# Patient Record
Sex: Male | Born: 1945 | Smoking: Former smoker
Health system: Southern US, Community
[De-identification: ages and names within clinical notes are randomized; demographics above are authoritative.]

## PROBLEM LIST (undated history)

## (undated) DIAGNOSIS — F32A Depression, unspecified: Secondary | ICD-10-CM

## (undated) DIAGNOSIS — M1990 Unspecified osteoarthritis, unspecified site: Secondary | ICD-10-CM

## (undated) DIAGNOSIS — Z9889 Other specified postprocedural states: Secondary | ICD-10-CM

## (undated) DIAGNOSIS — R112 Nausea with vomiting, unspecified: Secondary | ICD-10-CM

## (undated) DIAGNOSIS — Z87442 Personal history of urinary calculi: Secondary | ICD-10-CM

## (undated) DIAGNOSIS — I10 Essential (primary) hypertension: Secondary | ICD-10-CM

## (undated) HISTORY — PX: BACK SURGERY: SHX140

## (undated) HISTORY — PX: REPLACEMENT TOTAL KNEE BILATERAL: SUR1225

## (undated) HISTORY — PX: NECK SURGERY: SHX720

---

## 2020-05-22 ENCOUNTER — Other Ambulatory Visit (HOSPITAL_COMMUNITY): Payer: Self-pay | Admitting: Urology

## 2020-05-22 ENCOUNTER — Other Ambulatory Visit: Payer: Self-pay | Admitting: Urology

## 2020-05-22 DIAGNOSIS — N200 Calculus of kidney: Secondary | ICD-10-CM

## 2020-07-03 NOTE — Patient Instructions (Signed)
DUE TO COVID-19 ONLY ONE VISITOR IS ALLOWED TO COME WITH YOU AND STAY IN THE WAITING ROOM ONLY DURING PRE OP AND PROCEDURE DAY OF SURGERY. THE 1 VISITOR  MAY VISIT WITH YOU AFTER SURGERY IN YOUR PRIVATE ROOM DURING VISITING HOURS ONLY!  YOU NEED TO HAVE A COVID 19 TEST ON___1-3-22____ @_______ , THIS TEST MUST BE DONE BEFORE SURGERY,  COVID TESTING SITE [address] [place]  , IT IS ON THE RIGHT GOING OUT [address] APPROXIMATELY  2 MINUTES PAST [employer] ON THE RIGHT. ONCE YOUR COVID TEST IS COMPLETED,  PLEASE BEGIN THE QUARANTINE INSTRUCTIONS AS OUTLINED IN YOUR HANDOUT.                Brian Wilkins  07/03/2020   Your procedure is scheduled on: 07-12-20   Report to [facility] Main  Entrance   Report to admitting at       0800 AM     Call this number if you have problems the morning of surgery [phone]    Remember: Do not eat food  :After Midnight. You may have clear liquids until  1045 am then nothing by mouth    CLEAR LIQUID DIET   Foods Allowed                                                                       Black Coffee and tea, regular and decaf                              Plain Jell-O any favor except red or purple                                          Fruit ices (not with fruit pulp)                                      Iced Popsicles                                     Carbonated beverages, regular and diet                                    Cranberry, grape and apple juices Sports drinks like Gatorade Lightly seasoned clear broth or consume(fat free) Sugar, honey syrup       BRUSH YOUR TEETH MORNING OF SURGERY AND RINSE YOUR MOUTH OUT, NO CHEWING GUM CANDY OR MINTS.     Take these medicines the morning of surgery with A SIP OF WATER:   DO NOT TAKE ANY DIABETIC MEDICATIONS DAY OF YOUR SURGERY                               You may not have any metal on your body including hair  pins and               piercings  Do not wear jewelry, , lotions, powders or perfumes, deodorant                   Men may shave face and neck.   Do not bring valuables to the hospital. [place] IS NOT             RESPONSIBLE   FOR VALUABLES.  Contacts, dentures or bridgework may not be worn into surgery.       Patients discharged the day of surgery will not be allowed to drive home. IF YOU ARE HAVING SURGERY AND GOING HOME THE SAME DAY, YOU MUST HAVE AN ADULT TO DRIVE YOU HOME AND BE WITH YOU FOR 24 HOURS. YOU MAY GO HOME BY TAXI OR UBER OR ORTHERWISE, BUT AN ADULT MUST ACCOMPANY YOU HOME AND STAY WITH YOU FOR 24 HOURS.  Name and phone number of your driver:  Special Instructions: N/A              Please read over the following fact sheets you were given: _____________________________________________________________________             [facility] - Preparing for Surgery Before surgery, you can play an important role.  Because skin is not sterile, your skin needs to be as free of germs as possible.  You can reduce the number of germs on your skin by washing with CHG (chlorahexidine gluconate) soap before surgery.  CHG is an antiseptic cleaner which kills germs and bonds with the skin to continue killing germs even after washing. Please DO NOT use if you have an allergy to CHG or antibacterial soaps.  If your skin becomes reddened/irritated stop using the CHG and inform your nurse when you arrive at Short Stay. Do not shave (including legs and underarms) for at least 48 hours prior to the first CHG shower.  You may shave your face/neck. Please follow these instructions carefully:  1.  Shower with CHG Soap the night before surgery and the  morning of Surgery.  2.  If you choose to wash your hair, wash your hair first as usual with your  normal  shampoo.  3.  After you shampoo, rinse your hair and body thoroughly to remove the  shampoo.                           4.  Use CHG as you would any other liquid soap.   You can apply chg directly  to the skin and wash                       Gently with a scrungie or clean washcloth.  5.  Apply the CHG Soap to your body ONLY FROM THE NECK DOWN.   Do not use on face/ open                           Wound or open sores. Avoid contact with eyes, ears mouth and genitals (private parts).                       Wash face,  Genitals (private parts) with your normal soap.             6.  Wash thoroughly, paying special attention to the area where your  surgery  will be performed.  7.  Thoroughly rinse your body with warm water from the neck down.  8.  DO NOT shower/wash with your normal soap after using and rinsing off  the CHG Soap.                9.  Pat yourself dry with a clean towel.            10.  Wear clean pajamas.            11.  Place clean sheets on your bed the night of your first shower and do not  sleep with pets. Day of Surgery : Do not apply any lotions/deodorants the morning of surgery.  Please wear clean clothes to the hospital/surgery center.  FAILURE TO FOLLOW THESE INSTRUCTIONS MAY RESULT IN THE CANCELLATION OF YOUR SURGERY PATIENT SIGNATURE_________________________________  NURSE SIGNATURE__________________________________  ________________________________________________________________________

## 2020-07-03 NOTE — Progress Notes (Signed)
PCP -  Cardiologist -   PPM/ICD -  Device Orders -  Rep Notified -   Chest x-ray -  EKG -  Stress Test -  ECHO -  Cardiac Cath -   Sleep Study -  CPAP -   Fasting Blood Sugar -  Checks Blood Sugar _____ times a day  Blood Thinner Instructions: Aspirin Instructions:  [facility] - PRE-SURGERY  COVID TEST- 07-09-20  Activity- Anesthesia review:   Patient denies shortness of breath, fever, cough and chest pain at PAT appointment   All instructions explained to the patient, with a verbal understanding of the material. Patient agrees to go over the instructions while at home for a better understanding. Patient also instructed to self quarantine after being tested for COVID-19. The opportunity to ask questions was provided.

## 2020-07-04 ENCOUNTER — Encounter (HOSPITAL_COMMUNITY)
Admission: RE | Admit: 2020-07-04 | Discharge: 2020-07-04 | Disposition: A | Discharge status: Home / Self Care | Payer: Medicaid - Out of State | Source: Ambulatory Visit | Attending: Anesthesiology | Admitting: Anesthesiology

## 2020-07-04 NOTE — Progress Notes (Signed)
Pt. Did not come to preop. Called pt. Phone # went straight to voicemail. LVM with [facility] to inform pt. Did not come to preop

## 2020-07-09 ENCOUNTER — Other Ambulatory Visit: Payer: Self-pay

## 2020-07-09 ENCOUNTER — Encounter (HOSPITAL_COMMUNITY): Payer: Self-pay | Admitting: Urology

## 2020-07-09 NOTE — Progress Notes (Signed)
COVID Vaccine Completed:  x3 Date COVID Vaccine completed: COVID vaccine manufacturer: Pfizer    Moderna   [employer]   PCP - [place] Cardiologist - N/A  Chest x-ray - ? at [facility] EKG - ? At [facility] Stress Test -  ECHO -  Cardiac Cath -  Pacemaker/ICD device last checked:  Sleep Study - N/A CPAP -   Fasting Blood Sugar - N/A Checks Blood Sugar _____ times a day  Blood Thinner Instructions:  N/A Aspirin Instructions: Last Dose:  Anesthesia review:   Patient denies shortness of breath, fever, cough and chest pain at PAT appointment.  Patient able to climb a flight of stairs and perform ADLs independently.   Patient verbalized understanding of instructions that were given to them at the PAT appointment. Patient was also instructed that they will need to review over the PAT instructions again at home before surgery.

## 2020-07-10 ENCOUNTER — Other Ambulatory Visit: Payer: Self-pay | Admitting: Radiology

## 2020-07-10 ENCOUNTER — Other Ambulatory Visit (HOSPITAL_COMMUNITY)
Admission: RE | Admit: 2020-07-10 | Discharge: 2020-07-10 | Disposition: A | Discharge status: Home / Self Care | Payer: Medicare PPO | Source: Ambulatory Visit | Attending: Urology | Admitting: Urology

## 2020-07-10 DIAGNOSIS — Z01812 Encounter for preprocedural laboratory examination: Secondary | ICD-10-CM | POA: Insufficient documentation

## 2020-07-10 DIAGNOSIS — Z20822 Contact with and (suspected) exposure to covid-19: Secondary | ICD-10-CM | POA: Insufficient documentation

## 2020-07-10 LAB — SARS CORONAVIRUS 2 (TAT 6-24 HRS): SARS Coronavirus 2: NEGATIVE

## 2020-07-12 ENCOUNTER — Observation Stay (HOSPITAL_COMMUNITY)
Admission: RE | Admit: 2020-07-12 | Discharge: 2020-07-13 | Disposition: A | Discharge status: Home / Self Care | Payer: Medicare PPO | Attending: Urology | Admitting: Urology

## 2020-07-12 ENCOUNTER — Other Ambulatory Visit: Payer: Self-pay

## 2020-07-12 ENCOUNTER — Ambulatory Visit (HOSPITAL_COMMUNITY)
Admission: RE | Admit: 2020-07-12 | Discharge: 2020-07-12 | Disposition: A | Discharge status: Home / Self Care | Payer: Medicare PPO | Source: Ambulatory Visit | Attending: Urology | Admitting: Urology

## 2020-07-12 ENCOUNTER — Observation Stay (HOSPITAL_COMMUNITY): Payer: Medicare PPO

## 2020-07-12 ENCOUNTER — Ambulatory Visit (HOSPITAL_COMMUNITY): Payer: Medicare PPO | Admitting: Anesthesiology

## 2020-07-12 ENCOUNTER — Encounter (HOSPITAL_COMMUNITY)
Admission: RE | Disposition: A | Discharge status: Home / Self Care | Payer: Self-pay | Source: Home / Self Care | Attending: Urology

## 2020-07-12 ENCOUNTER — Encounter (HOSPITAL_COMMUNITY): Payer: Self-pay | Admitting: Urology

## 2020-07-12 DIAGNOSIS — N200 Calculus of kidney: Secondary | ICD-10-CM

## 2020-07-12 DIAGNOSIS — Z96653 Presence of artificial knee joint, bilateral: Secondary | ICD-10-CM | POA: Diagnosis not present

## 2020-07-12 DIAGNOSIS — Z79899 Other long term (current) drug therapy: Secondary | ICD-10-CM | POA: Insufficient documentation

## 2020-07-12 DIAGNOSIS — I10 Essential (primary) hypertension: Secondary | ICD-10-CM | POA: Diagnosis not present

## 2020-07-12 HISTORY — PX: IR URETERAL STENT LEFT NEW ACCESS W/O SEP NEPHROSTOMY CATH: IMG6075

## 2020-07-12 HISTORY — DX: Personal history of urinary calculi: Z87.442

## 2020-07-12 HISTORY — DX: Other specified postprocedural states: Z98.890

## 2020-07-12 HISTORY — DX: Other specified postprocedural states: R11.2

## 2020-07-12 HISTORY — DX: Depression, unspecified: F32.A

## 2020-07-12 HISTORY — DX: Essential (primary) hypertension: I10

## 2020-07-12 HISTORY — PX: NEPHROLITHOTOMY: SHX5134

## 2020-07-12 HISTORY — DX: Unspecified osteoarthritis, unspecified site: M19.90

## 2020-07-12 LAB — CBC WITH DIFFERENTIAL/PLATELET
Abs Immature Granulocytes: 0.02 10*3/uL (ref 0.00–0.07)
Basophils Absolute: 0.1 10*3/uL (ref 0.0–0.1)
Basophils Relative: 1 %
Eosinophils Absolute: 0.2 10*3/uL (ref 0.0–0.5)
Eosinophils Relative: 3 %
HCT: 42.3 % (ref 39.0–52.0)
Hemoglobin: 12.3 g/dL — ABNORMAL LOW (ref 13.0–17.0)
Immature Granulocytes: 0 %
Lymphocytes Relative: 28 %
Lymphs Abs: 1.7 10*3/uL (ref 0.7–4.0)
MCH: 21.8 pg — ABNORMAL LOW (ref 26.0–34.0)
MCHC: 29.1 g/dL — ABNORMAL LOW (ref 30.0–36.0)
MCV: 75 fL — ABNORMAL LOW (ref 80.0–100.0)
Monocytes Absolute: 0.6 10*3/uL (ref 0.1–1.0)
Monocytes Relative: 10 %
Neutro Abs: 3.5 10*3/uL (ref 1.7–7.7)
Neutrophils Relative %: 58 %
Platelets: 202 10*3/uL (ref 150–400)
RBC: 5.64 MIL/uL (ref 4.22–5.81)
RDW: 16.7 % — ABNORMAL HIGH (ref 11.5–15.5)
WBC: 6 10*3/uL (ref 4.0–10.5)
nRBC: 0 % (ref 0.0–0.2)

## 2020-07-12 LAB — BASIC METABOLIC PANEL
Anion gap: 6 (ref 5–15)
BUN: 16 mg/dL (ref 8–23)
CO2: 26 mmol/L (ref 22–32)
Calcium: 11.1 mg/dL — ABNORMAL HIGH (ref 8.9–10.3)
Chloride: 108 mmol/L (ref 98–111)
Creatinine, Ser: 1.49 mg/dL — ABNORMAL HIGH (ref 0.61–1.24)
GFR, Estimated: 49 mL/min — ABNORMAL LOW (ref >60)
Glucose, Bld: 90 mg/dL (ref 70–99)
Potassium: 4.7 mmol/L (ref 3.5–5.1)
Sodium: 140 mmol/L (ref 135–145)

## 2020-07-12 LAB — TYPE AND SCREEN
ABO/RH(D): B POS
Antibody Screen: NEGATIVE

## 2020-07-12 LAB — PROTIME-INR
INR: 1.1 (ref 0.8–1.2)
Prothrombin Time: 13.6 seconds (ref 11.4–15.2)

## 2020-07-12 LAB — ABO/RH: ABO/RH(D): B POS

## 2020-07-12 SURGERY — NEPHROLITHOTOMY PERCUTANEOUS
Anesthesia: General | Site: Back | Laterality: Left

## 2020-07-12 MED ORDER — DEXAMETHASONE SODIUM PHOSPHATE 10 MG/ML IJ SOLN
INTRAMUSCULAR | Status: DC | PRN
  Administered 2020-07-12: 10 mg via INTRAVENOUS

## 2020-07-12 MED ORDER — EPHEDRINE SULFATE-NACL 50-0.9 MG/10ML-% IV SOSY
PREFILLED_SYRINGE | INTRAVENOUS | Status: DC | PRN
  Administered 2020-07-12: 10 mg via INTRAVENOUS

## 2020-07-12 MED ORDER — LIDOCAINE HCL (PF) 2 % IJ SOLN
INTRAMUSCULAR | Status: AC
  Filled 2020-07-12: qty 5

## 2020-07-12 MED ORDER — ROCURONIUM BROMIDE 10 MG/ML (PF) SYRINGE
PREFILLED_SYRINGE | INTRAVENOUS | Status: AC
  Filled 2020-07-12: qty 10

## 2020-07-12 MED ORDER — ORAL CARE MOUTH RINSE: 15.0000 mL | Freq: Once | OROMUCOSAL | Status: AC

## 2020-07-12 MED ORDER — QUETIAPINE FUMARATE 200 MG PO TABS
200.0000 mg | ORAL_TABLET | Freq: Every day | ORAL | Status: DC
  Administered 2020-07-12: 200 mg via ORAL
  Filled 2020-07-12: qty 1

## 2020-07-12 MED ORDER — OXYCODONE HCL 5 MG PO TABS
5.0000 mg | ORAL_TABLET | ORAL | Status: DC | PRN
Start: 2020-07-12 — End: 2020-07-13
  Administered 2020-07-13: 5 mg via ORAL
  Filled 2020-07-12: qty 1

## 2020-07-12 MED ORDER — ONDANSETRON HCL 4 MG/2ML IJ SOLN
INTRAMUSCULAR | Status: AC
  Administered 2020-07-12: 4 mg
  Filled 2020-07-12: qty 2

## 2020-07-12 MED ORDER — PHENYLEPHRINE 40 MCG/ML (10ML) SYRINGE FOR IV PUSH (FOR BLOOD PRESSURE SUPPORT)
PREFILLED_SYRINGE | INTRAVENOUS | Status: AC
  Filled 2020-07-12: qty 10

## 2020-07-12 MED ORDER — ACETAMINOPHEN 325 MG PO TABS: 650.0000 mg | ORAL_TABLET | ORAL | Status: DC | PRN

## 2020-07-12 MED ORDER — SULFAMETHOXAZOLE-TRIMETHOPRIM 800-160 MG PO TABS
1.0000 | ORAL_TABLET | Freq: Two times a day (BID) | ORAL | Status: DC
  Administered 2020-07-12 – 2020-07-13 (×2): 1 via ORAL
  Filled 2020-07-12 (×2): qty 1

## 2020-07-12 MED ORDER — FENTANYL CITRATE (PF) 250 MCG/5ML IJ SOLN
INTRAMUSCULAR | Status: AC
  Filled 2020-07-12: qty 5

## 2020-07-12 MED ORDER — IOHEXOL 300 MG/ML  SOLN
INTRAMUSCULAR | Status: DC | PRN
  Administered 2020-07-12: 10 mL

## 2020-07-12 MED ORDER — FENTANYL CITRATE (PF) 100 MCG/2ML IJ SOLN
INTRAMUSCULAR | Status: DC | PRN
  Administered 2020-07-12 (×2): 50 ug via INTRAVENOUS

## 2020-07-12 MED ORDER — 0.9 % SODIUM CHLORIDE (POUR BTL) OPTIME
TOPICAL | Status: DC | PRN
  Administered 2020-07-12: 1000 mL

## 2020-07-12 MED ORDER — CIPROFLOXACIN IN D5W 400 MG/200ML IV SOLN
INTRAVENOUS | Status: AC
  Administered 2020-07-12: 400 mg via INTRAVENOUS
  Filled 2020-07-12: qty 200

## 2020-07-12 MED ORDER — LIDOCAINE-EPINEPHRINE 1 %-1:100000 IJ SOLN
INTRAMUSCULAR | Status: AC | PRN
  Administered 2020-07-12: 10 mL via INTRADERMAL

## 2020-07-12 MED ORDER — OXYCODONE HCL 5 MG/5ML PO SOLN: 5.0000 mg | Freq: Once | ORAL | Status: DC | PRN

## 2020-07-12 MED ORDER — LOSARTAN POTASSIUM 50 MG PO TABS
100.0000 mg | ORAL_TABLET | Freq: Every day | ORAL | Status: DC
  Administered 2020-07-13: 100 mg via ORAL
  Filled 2020-07-12: qty 2

## 2020-07-12 MED ORDER — IOHEXOL 300 MG/ML  SOLN
50.0000 mL | Freq: Once | INTRAMUSCULAR | Status: AC | PRN
  Administered 2020-07-12: 15 mL

## 2020-07-12 MED ORDER — OXYCODONE HCL 5 MG PO TABS: 5.0000 mg | ORAL_TABLET | Freq: Once | ORAL | Status: DC | PRN

## 2020-07-12 MED ORDER — LIDOCAINE 2% (20 MG/ML) 5 ML SYRINGE
INTRAMUSCULAR | Status: DC | PRN
  Administered 2020-07-12: 60 mg via INTRAVENOUS

## 2020-07-12 MED ORDER — CIPROFLOXACIN IN D5W 400 MG/200ML IV SOLN
400.0000 mg | INTRAVENOUS | Status: AC
  Filled 2020-07-12: qty 200

## 2020-07-12 MED ORDER — SENNA 8.6 MG PO TABS
1.0000 | ORAL_TABLET | Freq: Two times a day (BID) | ORAL | Status: DC
  Administered 2020-07-12 – 2020-07-13 (×2): 8.6 mg via ORAL
  Filled 2020-07-12 (×2): qty 1

## 2020-07-12 MED ORDER — ONDANSETRON HCL 4 MG/2ML IJ SOLN
INTRAMUSCULAR | Status: AC
  Filled 2020-07-12: qty 2

## 2020-07-12 MED ORDER — FENTANYL CITRATE (PF) 100 MCG/2ML IJ SOLN
25.0000 ug | INTRAMUSCULAR | Status: DC | PRN
Start: 2020-07-12 — End: 2020-07-13
  Administered 2020-07-12: 50 ug via INTRAVENOUS

## 2020-07-12 MED ORDER — GLYCOPYRROLATE 0.2 MG/ML IJ SOLN
INTRAMUSCULAR | Status: DC | PRN
  Administered 2020-07-12: .2 mg via INTRAVENOUS

## 2020-07-12 MED ORDER — FENTANYL CITRATE (PF) 100 MCG/2ML IJ SOLN
25.0000 ug | INTRAMUSCULAR | Status: DC | PRN
  Administered 2020-07-12: 50 ug via INTRAVENOUS

## 2020-07-12 MED ORDER — HYDROMORPHONE HCL 1 MG/ML IJ SOLN: 0.5000 mg | INTRAMUSCULAR | Status: DC | PRN

## 2020-07-12 MED ORDER — MIDAZOLAM HCL 2 MG/2ML IJ SOLN
INTRAMUSCULAR | Status: AC
  Filled 2020-07-12: qty 2

## 2020-07-12 MED ORDER — CIPROFLOXACIN IN D5W 400 MG/200ML IV SOLN: 400.0000 mg | INTRAVENOUS | Status: DC

## 2020-07-12 MED ORDER — LIDOCAINE-EPINEPHRINE 1 %-1:100000 IJ SOLN
INTRAMUSCULAR | Status: AC
  Filled 2020-07-12: qty 1

## 2020-07-12 MED ORDER — DEXAMETHASONE SODIUM PHOSPHATE 10 MG/ML IJ SOLN
INTRAMUSCULAR | Status: AC
  Filled 2020-07-12: qty 1

## 2020-07-12 MED ORDER — FENTANYL CITRATE (PF) 100 MCG/2ML IJ SOLN
INTRAMUSCULAR | Status: AC
  Filled 2020-07-12: qty 2

## 2020-07-12 MED ORDER — GABAPENTIN 300 MG PO CAPS
ORAL_CAPSULE | ORAL | Status: AC
  Filled 2020-07-12: qty 1

## 2020-07-12 MED ORDER — FENTANYL CITRATE (PF) 100 MCG/2ML IJ SOLN
INTRAMUSCULAR | Status: AC | PRN
Start: 2020-07-12 — End: 2020-07-12
  Administered 2020-07-12 (×2): 50 ug via INTRAVENOUS

## 2020-07-12 MED ORDER — MIDAZOLAM HCL 2 MG/2ML IJ SOLN
INTRAMUSCULAR | Status: AC | PRN
  Administered 2020-07-12 (×4): 1 mg via INTRAVENOUS

## 2020-07-12 MED ORDER — SUGAMMADEX SODIUM 200 MG/2ML IV SOLN
INTRAVENOUS | Status: DC | PRN
  Administered 2020-07-12: 200 mg via INTRAVENOUS
  Administered 2020-07-12: 100 mg via INTRAVENOUS

## 2020-07-12 MED ORDER — ONDANSETRON HCL 4 MG/2ML IJ SOLN: 4.0000 mg | Freq: Once | INTRAMUSCULAR | Status: DC | PRN

## 2020-07-12 MED ORDER — ONDANSETRON HCL 4 MG/2ML IJ SOLN: 4.0000 mg | INTRAMUSCULAR | Status: DC | PRN

## 2020-07-12 MED ORDER — LACTATED RINGERS IV SOLN: INTRAVENOUS | Status: DC

## 2020-07-12 MED ORDER — ONDANSETRON HCL 4 MG/2ML IJ SOLN
INTRAMUSCULAR | Status: DC | PRN
  Administered 2020-07-12: 4 mg via INTRAVENOUS

## 2020-07-12 MED ORDER — AMLODIPINE BESYLATE 10 MG PO TABS
10.0000 mg | ORAL_TABLET | Freq: Every day | ORAL | Status: DC
  Administered 2020-07-13: 10 mg via ORAL
  Filled 2020-07-12: qty 1

## 2020-07-12 MED ORDER — CHLORHEXIDINE GLUCONATE 0.12% ORAL RINSE (MEDLINE KIT)
15.0000 mL | Freq: Once | OROMUCOSAL | Status: AC
  Administered 2020-07-12: 15 mL via OROMUCOSAL

## 2020-07-12 MED ORDER — EPHEDRINE 5 MG/ML INJ
INTRAVENOUS | Status: AC
  Filled 2020-07-12: qty 10

## 2020-07-12 MED ORDER — FENTANYL CITRATE (PF) 100 MCG/2ML IJ SOLN
INTRAMUSCULAR | Status: AC
  Filled 2020-07-12: qty 4

## 2020-07-12 MED ORDER — ROCURONIUM BROMIDE 10 MG/ML (PF) SYRINGE
PREFILLED_SYRINGE | INTRAVENOUS | Status: DC | PRN
  Administered 2020-07-12: 50 mg via INTRAVENOUS

## 2020-07-12 MED ORDER — SODIUM CHLORIDE 0.9 % IR SOLN
Status: DC | PRN
  Administered 2020-07-12: 6000 mL

## 2020-07-12 MED ORDER — SODIUM CHLORIDE 0.45 % IV SOLN: INTRAVENOUS | Status: DC

## 2020-07-12 MED ORDER — PROPOFOL 10 MG/ML IV BOLUS
INTRAVENOUS | Status: AC
  Filled 2020-07-12: qty 20

## 2020-07-12 MED ORDER — PHENYLEPHRINE 40 MCG/ML (10ML) SYRINGE FOR IV PUSH (FOR BLOOD PRESSURE SUPPORT)
PREFILLED_SYRINGE | INTRAVENOUS | Status: DC | PRN
  Administered 2020-07-12 (×3): 80 ug via INTRAVENOUS
  Administered 2020-07-12 (×2): 120 ug via INTRAVENOUS
  Administered 2020-07-12: 80 ug via INTRAVENOUS
  Administered 2020-07-12: 120 ug via INTRAVENOUS

## 2020-07-12 MED ORDER — GABAPENTIN 300 MG PO CAPS
300.0000 mg | ORAL_CAPSULE | Freq: Every day | ORAL | Status: DC
  Administered 2020-07-12: 300 mg via ORAL

## 2020-07-12 MED ORDER — PROPOFOL 10 MG/ML IV BOLUS
INTRAVENOUS | Status: DC | PRN
  Administered 2020-07-12: 150 mg via INTRAVENOUS

## 2020-07-12 SURGICAL SUPPLY — 49 items
BAG URINE DRAIN 2000ML AR STRL (UROLOGICAL SUPPLIES) IMPLANT
BASKET ZERO TIP NITINOL 2.4FR (BASKET) ×3 IMPLANT
BENZOIN TINCTURE PRP APPL 2/3 (GAUZE/BANDAGES/DRESSINGS) ×6 IMPLANT
BLADE SURG 15 STRL LF DISP TIS (BLADE) ×2 IMPLANT
BLADE SURG 15 STRL SS (BLADE) ×1
CATH FOLEY 2W COUNCIL 20FR 5CC (CATHETERS) IMPLANT
CATH ROBINSON RED A/P 20FR (CATHETERS) IMPLANT
CATH X-FORCE N30 NEPHROSTOMY (TUBING) ×3 IMPLANT
CHLORAPREP W/TINT 26 (MISCELLANEOUS) ×3 IMPLANT
COVER SURGICAL LIGHT HANDLE (MISCELLANEOUS) ×3 IMPLANT
COVER WAND RF STERILE (DRAPES) IMPLANT
DRAPE C-ARM 42X120 X-RAY (DRAPES) ×3 IMPLANT
DRAPE LINGEMAN PERC (DRAPES) ×3 IMPLANT
DRAPE SURG IRRIG POUCH 19X23 (DRAPES) ×3 IMPLANT
DRSG PAD ABDOMINAL 8X10 ST (GAUZE/BANDAGES/DRESSINGS) IMPLANT
DRSG TEGADERM 8X12 (GAUZE/BANDAGES/DRESSINGS) ×6 IMPLANT
EXTRACTOR STONE 1.7FRX115CM (UROLOGICAL SUPPLIES) ×3 IMPLANT
GAUZE SPONGE 4X4 12PLY STRL (GAUZE/BANDAGES/DRESSINGS) ×3 IMPLANT
GLOVE BIOGEL M 8.0 STRL (GLOVE) ×3 IMPLANT
GOWN STRL REUS W/TWL XL LVL3 (GOWN DISPOSABLE) ×3 IMPLANT
GUIDEWIRE AMPLAZ .035X145 (WIRE) ×6 IMPLANT
KIT BASIN OR (CUSTOM PROCEDURE TRAY) ×3 IMPLANT
KIT PROBE 340X3.4XDISP GRN (MISCELLANEOUS) ×2 IMPLANT
KIT PROBE TRILOGY 3.4X340 (MISCELLANEOUS) ×1
KIT PROBE TRILOGY 3.9X350 (MISCELLANEOUS) IMPLANT
KIT TURNOVER KIT A (KITS) IMPLANT
LASER FIB FLEXIVA PULSE ID 365 (Laser) IMPLANT
LASER FIB FLEXIVA PULSE ID 550 (Laser) IMPLANT
LASER FIB FLEXIVA PULSE ID 910 (Laser) IMPLANT
MANIFOLD NEPTUNE II (INSTRUMENTS) ×3 IMPLANT
NS IRRIG 1000ML POUR BTL (IV SOLUTION) ×3 IMPLANT
PACK CYSTO (CUSTOM PROCEDURE TRAY) ×3 IMPLANT
PENCIL SMOKE EVACUATOR (MISCELLANEOUS) IMPLANT
SHEATH PEELAWAY SET 9 (SHEATH) ×3 IMPLANT
SPONGE LAP 4X18 RFD (DISPOSABLE) ×3 IMPLANT
STENT URET 6FRX24 CONTOUR (STENTS) ×3 IMPLANT
SUT SILK 2 0 30  PSL (SUTURE) ×1
SUT SILK 2 0 30 PSL (SUTURE) ×2 IMPLANT
SYR 10ML LL (SYRINGE) ×3 IMPLANT
SYR 20ML LL LF (SYRINGE) ×6 IMPLANT
TAPE CLOTH SURG 6X10 WHT LF (GAUZE/BANDAGES/DRESSINGS) ×3 IMPLANT
TRACTIP FLEXIVA PULS ID 200XHI (Laser) IMPLANT
TRACTIP FLEXIVA PULSE ID 200 (Laser)
TRAY FOLEY MTR SLVR 14FR STAT (SET/KITS/TRAYS/PACK) IMPLANT
TRAY FOLEY MTR SLVR 16FR STAT (SET/KITS/TRAYS/PACK) ×3 IMPLANT
TUBING CONNECTING 10 (TUBING) ×6 IMPLANT
TUBING STONE CATCHER TRILOGY (MISCELLANEOUS) ×3 IMPLANT
TUBING UROLOGY SET (TUBING) ×3 IMPLANT
WATER STERILE IRR 1000ML POUR (IV SOLUTION) ×3 IMPLANT

## 2020-07-12 NOTE — Procedures (Signed)
Interventional Radiology Procedure Note  Procedure: Percutaneous nephroureteral catheter access  Findings: Please refer to procedural dictation for full description. 5 Fr catheter placed via lower pole calyx with distal tip in bladder.  Puncture made inferior to large mid to lower pole cyst.    Complications: None immediate  Estimated Blood Loss: < 5 mL  Recommendations: To OR with Dr. Retta Diones.   Marliss Coots, MD Pager: [phone]

## 2020-07-12 NOTE — Consult Note (Signed)
Chief Complaint: Patient was seen in consultation today for left percutaneous nephrostomy/nephroureteral catheter placement and possible left renal cyst aspiration  Referring Physician(s): Dahlstedt,S  Supervising Physician: Marliss Coots  Patient Status: [facility] - Out-pt TBA  History of Present Illness: Shamarr P. Branam is a 75 y.o. male with past medical history of arthritis, depression, hypertension, diverticulosis and nephrolithiasis with a recently noted 1.9 cm left renal pelvic stone as well as 3.5 cm left renal cyst.  He presents today for left percutaneous nephrostomy/nephroureteral catheter placement as well as possible image guided left renal cyst aspiration prior to nephrolithotomy.  Past Medical History:  Diagnosis Date  . Arthritis    knees  . Depression   . History of kidney stones   . Hypertension     Past Surgical History:  Procedure Laterality Date  . BACK SURGERY    . NECK SURGERY    . REPLACEMENT TOTAL KNEE BILATERAL      Allergies: Penicillins  Medications: Prior to Admission medications   Medication Sig Start Date End Date Taking? Authorizing Provider  amLODipine (NORVASC) 5 MG tablet Take by mouth daily.    [provider]  GABAPENTIN PO Take by mouth.    [provider]  losartan (COZAAR) 100 MG tablet Take 100 mg by mouth daily.    [provider]     History reviewed. No pertinent family history.  Social History   Socioeconomic History  . Marital status: Unknown    Spouse name: Not on file  . Number of children: Not on file  . Years of education: Not on file  . Highest education level: Not on file  Occupational History  . Not on file  Tobacco Use  . Smoking status: Former Smoker    Packs/day: 1.00    Years: 0.00    Pack years: 0.00    Quit date: 05/07/2020    Years since quitting: 0.1  . Smokeless tobacco: Never Used  Vaping Use  . Vaping Use: Former  Substance and Sexual Activity  . Alcohol use:  Never  . Drug use: Never  . Sexual activity: Not on file  Other Topics Concern  . Not on file  Social History Narrative  . Not on file   Social Determinants of Health   Financial Resource Strain: Not on file  Food Insecurity: Not on file  Transportation Needs: Not on file  Physical Activity: Not on file  Stress: Not on file  Social Connections: Not on file      Review of Systems currently denies fever, headache, chest pain, dyspnea, cough, abdominal/back pain, nausea, vomiting, visible hematuria, or dysuria.  Vital Signs:pending Ht 5\' 4"  (1.626 m)   Wt 186 lb (84.4 kg)   BMI 31.93 kg/m   Physical Exam awake, alert.  Chest clear to auscultation bilaterally.  Heart with regular rate and rhythm.  Abdomen soft, positive bowel sounds, nontender.  Trace pretibial edema bilaterally.  Imaging: No results found.  Labs:  CBC: No results for input(s): WBC, HGB, HCT, PLT in the last 8760 hours.  COAGS: No results for input(s): INR, APTT in the last 8760 hours.  BMP: No results for input(s): NA, K, CL, CO2, GLUCOSE, BUN, CALCIUM, CREATININE, GFRNONAA, GFRAA in the last 8760 hours.  Invalid input(s): CMP  LIVER FUNCTION TESTS: No results for input(s): BILITOT, AST, ALT, ALKPHOS, PROT, ALBUMIN in the last 8760 hours.  TUMOR MARKERS: No results for input(s): AFPTM, CEA, CA199, CHROMGRNA in the last 8760 hours.  Assessment and  Plan: 75 y.o. male with past medical history of arthritis, depression, hypertension, diverticulosis and nephrolithiasis with a recently noted 1.9 cm left renal pelvic stone as well as 3.5 cm left renal cyst.  He presents today for left percutaneous nephrostomy/nephroureteral catheter placement as well as possible image guided left renal cyst aspiration prior to nephrolithotomy.  Details/risks of procedures, including but not limited to, internal bleeding, infection, injury to adjacent structures discussed with patient with his understanding and  consent.   Thank you for this interesting consult.  I greatly enjoyed meeting [employer] Rozar and look forward to participating in their care.  A copy of this report was sent to the requesting provider on this date.  Electronically Signed: D. Jeananne Rama, PA-C 07/12/2020, 8:53 AM   I spent a total of  25 minutes   in face to face in clinical consultation, greater than 50% of which was counseling/coordinating care for left percutaneous nephrostomy/nephroureteral catheter placement and possible left renal cyst aspiration

## 2020-07-12 NOTE — Anesthesia Preprocedure Evaluation (Addendum)
Anesthesia Evaluation  Patient identified by MRN, date of birth, ID band Patient awake    Reviewed: Allergy & Precautions, NPO status , Patient's Chart, lab work & pertinent test results  History of Anesthesia Complications (+) PONV and history of anesthetic complications  Airway Mallampati: II  TM Distance: >3 FB Neck ROM: Full    Dental  (+) Edentulous Upper, Edentulous Lower   Pulmonary former smoker,    Pulmonary exam normal        Cardiovascular hypertension, Pt. on medications Normal cardiovascular exam     Neuro/Psych PSYCHIATRIC DISORDERS Depression negative neurological ROS     GI/Hepatic negative GI ROS, Neg liver ROS,   Endo/Other   Obesity   Renal/GU negative Renal ROS     Musculoskeletal  (+) Arthritis ,   Abdominal   Peds  Hematology negative hematology ROS (+)   Anesthesia Other Findings Covid test negative   Reproductive/Obstetrics                           Anesthesia Physical Anesthesia Plan  ASA: II  Anesthesia Plan: General   Post-op Pain Management:    Induction: Intravenous  PONV Risk Score and Plan: 2 and Treatment may vary due to age or medical condition, Ondansetron and Dexamethasone  Airway Management Planned: Oral ETT  Additional Equipment: None  Intra-op Plan:   Post-operative Plan: Extubation in OR  Informed Consent: I have reviewed the patients History and Physical, chart, labs and discussed the procedure including the risks, benefits and alternatives for the proposed anesthesia with the patient or authorized representative who has indicated his/her understanding and acceptance.     Dental advisory given  Plan Discussed with: CRNA and Anesthesiologist  Anesthesia Plan Comments:       Anesthesia Quick Evaluation

## 2020-07-12 NOTE — H&P (Signed)
H&P  Chief Complaint: Left renal stone  History of Present Illness: 75 yo male w/ large Lt renal stone presents for Lt PCNL. HE initially presented from the [facility] for mgmt of this. I have discussed the procedure as well as risks/complications w/ him. He understands these and desires to proceed.  Past Medical History:  Diagnosis Date  . Arthritis    knees  . Depression   . History of kidney stones   . Hypertension     Past Surgical History:  Procedure Laterality Date  . BACK SURGERY    . NECK SURGERY    . REPLACEMENT TOTAL KNEE BILATERAL      Home Medications:  Allergies as of 07/12/2020      Reactions   Penicillins       Medication List    Notice   Cannot display discharge medications because the patient has not yet been admitted.     Allergies:  Allergies  Allergen Reactions  . Penicillins     History reviewed. No pertinent family history.  Social History:  reports that he quit smoking about 2 months ago. He smoked 1.00 pack per day for 0.00 years. He has never used smokeless tobacco. He reports that he does not drink alcohol and does not use drugs.  ROS: A complete review of systems was performed.  All systems are negative except for pertinent findings as noted.  Physical Exam:  Vital signs in last 24 hours: Ht 5\' 4"  (1.626 m)   Wt 84.4 kg   BMI 31.93 kg/m  Constitutional:  Alert and oriented, No acute distress Cardiovascular: Regular rate  Respiratory: Normal respiratory effort GI: Abdomen is soft, nontender, nondistended, no abdominal masses. No CVAT.  Genitourinary: Normal male phallus, testes are descended bilaterally and non-tender and without masses, scrotum is normal in appearance without lesions or masses, perineum is normal on inspection. Lymphatic: No lymphadenopathy Neurologic: Grossly intact, no focal deficits Psychiatric: Normal mood and affect  Laboratory Data:  No results for input(s): WBC, HGB, HCT, PLT in the last 72 hours.  No results  for input(s): NA, K, CL, GLUCOSE, BUN, CALCIUM, CREATININE in the last 72 hours.  Invalid input(s): CO3   No results found for this or any previous visit (from the past 24 hour(s)). Recent Results (from the past 240 hour(s))  SARS CORONAVIRUS 2 (TAT 6-24 HRS) Nasopharyngeal Nasopharyngeal Swab     Status: None   Collection Time: 07/10/20 10:25 AM   Specimen: Nasopharyngeal Swab  Result Value Ref Range Status   SARS Coronavirus 2 NEGATIVE NEGATIVE Final    Comment: (NOTE) SARS-CoV-2 target nucleic acids are NOT DETECTED.  The SARS-CoV-2 RNA is generally detectable in upper and lower respiratory specimens during the acute phase of infection. Negative results do not preclude SARS-CoV-2 infection, do not rule out co-infections with other pathogens, and should not be used as the sole basis for treatment or other patient management decisions. Negative results must be combined with clinical observations, patient history, and epidemiological information. The expected result is Negative.  Fact Sheet for Patients: 09/07/20  Fact Sheet for Healthcare Providers: HairSlick.no  This test is not yet approved or cleared by the quierodirigir.com FDA and  has been authorized for detection and/or diagnosis of SARS-CoV-2 by FDA under an Emergency Use Authorization (EUA). This EUA will remain  in effect (meaning this test can be used) for the duration of the COVID-19 declaration under Se ction 564(b)(1) of the Act, 21 U.S.C. section 360bbb-3(b)(1), unless the authorization is  terminated or revoked sooner.  Performed at [facility], [address], [place], [place] [address]     Renal Function: No results for input(s): CREATININE in the last 168 hours. CrCl cannot be calculated (No successful lab value found.).  Radiologic Imaging: No results found.  Impression/Assessment:  Lt renalstone  Plan:  Lt PCNL

## 2020-07-12 NOTE — Interval H&P Note (Signed)
History and Physical Interval Note:  07/12/2020 1:18 PM  Brian Wilkins  has presented today for surgery, with the diagnosis of LEFT RENAL STONE.  The various methods of treatment have been discussed with the patient and family. After consideration of risks, benefits and other options for treatment, the patient has consented to  Procedure(s) with comments: NEPHROLITHOTOMY PERCUTANEOUS (Left) - 2 hrs HOLMIUM LASER APPLICATION (Left) as a surgical intervention.  The patient's history has been reviewed, patient examined, no change in status, stable for surgery.  I have reviewed the patient's chart and labs.  Questions were answered to the patient's satisfaction.     Bertram Millard Caileb Rhue

## 2020-07-12 NOTE — Op Note (Signed)
Preoperative diagnosis: 19 mm left renal pelvic calculus  Postoperative diagnosis: Same  Principal procedure: Left percutaneous nephrolithotomy, fluoroscopic interpretation  Surgeon: Vladislav Axelson  Anesthesia: General endotracheal  Complications: None  Specimen: Stone fragments  Drains: 4 French Foley catheter to bedside bag, 26 x 6 contour double-J stent without tether  Estimated blood loss: Less than 100 mL  Indications: 75 year old male, referred by the [facility] medical system for percutaneous management of a 19 mm symptomatic left renal pelvic stone.  The patient has been assessed in the office, and I have discussed the procedure of percutaneous management with access provided by interventional radiology.  He understands the procedure, risks and complications including but not limited to bleeding, need for transfusion, infection, need for percutaneous tube for short or longer period of time, internal stent as well as anesthetic complications.  He understands these and desires to proceed.  Findings: The pelvis was mildly dilated.  There was a solitary renal stone.  No urothelial lesions were noted.  Description of procedure: The patient was properly identified and marked in the holding area.  He had received/underwent percutaneous nephrostomy tube access by Dr. Serafina Royals in the interventional radiology suite.  This was accessed through the lower pole  He received IV antibiotics in the interventional radiology suite.  He was then taken to the operating room where general anesthetic with endotracheal apparatus was performed.  Foley catheter was placed.  The patient was then transferred to the prone position.  All extremities and pressure points were padded appropriately.  His left flank was prepped around the indwelling nephrostomy tube.  He was then draped.  Timeout was performed.  I guided a Super Stiff guidewire through the existing nephrostomy tube, using fluoroscopic guidance this was advanced into  the bladder where a curl was seen.  The nephrostomy tube was then removed and I placed a 12 French peel-away access sheath to the mid ureter.  The obturator was removed and a second safety wire was placed into the bladder using fluoroscopic guidance.  I incised the skin just lateral to the access site and this was bluntly dissected with a hemostat.  I then placed the NephroMax balloon using fluoroscopic guidance, this was placed up to the stone, located fluoroscopically.  The balloon was then dilated to 18 atm of pressure and left dilated for approximately 2 minutes before the nephrostomy access sheath was placed over top of the balloon.  This is guided up to the area of the renal pelvis.  The balloon was then deflated and removed over top of the existing working guidewire.  I then passed the nephroscope through the access sheath, and the pelvis was easily accessed.  A few small clots were picked out.  The stone was easily identified.  There was a solitary stone.  Using the trilogy lithotrite, the stone was fragmented into smaller fragments which were then easily removed under direct vision through the access catheter.  2-3 small fragments rinsed into the upper ureter.  These were removed with a 0 tip nitinol basket.  Careful inspection of the identifiable calyces and the pelvis revealed no further fragments.  At this point the nephroscope was backed out, and passed over top of the working guidewire.  A 26 cm x 6 French contour double-J stent was then passed over top of the guidewire into the bladder, identified fluoroscopically.  Once adequately positioned, the working guidewire was removed.  An excellent curl of the distal stent was seen within the bladder.  A good curl was also  seen in the renal pelvis.  I measured the distance between the lateral lower pole calyx and the skin.  A trocar was then passed through the nephrostomy access sheath down to the level of the renal parenchyma.  The access catheter was  removed, and the trocar with the FloSeal was then emptied into the nephrostomy access tract.  At this point, hemostasis was excellent.  The incision was sutured using 2 separate vertical mattress sutures using 2-0 silk.  Dry sterile dressing was placed.  At this point, the procedure was terminated.  The patient was awakened once in the supine position.  He was extubated, taken to the PACU in stable condition having tolerated the procedure well.  Sponge needle and instrument counts were correct x2.

## 2020-07-12 NOTE — Anesthesia Procedure Notes (Signed)
Date/Time: 07/12/2020 2:49 PM Performed by: Minerva Ends, CRNA Oxygen Delivery Method: Simple face mask Placement Confirmation: positive ETCO2 and breath sounds checked- equal and bilateral Dental Injury: Teeth and Oropharynx as per pre-operative assessment

## 2020-07-12 NOTE — Discharge Instructions (Signed)

## 2020-07-12 NOTE — Anesthesia Procedure Notes (Signed)
Procedure Name: Intubation Date/Time: 07/12/2020 1:39 PM Performed by: Rogers Blocker, CRNA Pre-anesthesia Checklist: Patient identified, Emergency Drugs available, Suction available and Patient being monitored Patient Re-evaluated:Patient Re-evaluated prior to induction Oxygen Delivery Method: Circle System Utilized Preoxygenation: Pre-oxygenation with 100% oxygen Induction Type: IV induction Ventilation: Mask ventilation without difficulty Laryngoscope Size: Mac and 4 Grade View: Grade I Tube type: Oral Number of attempts: 1 Airway Equipment and Method: Stylet Placement Confirmation: ETT inserted through vocal cords under direct vision,  positive ETCO2 and breath sounds checked- equal and bilateral Secured at: 23 cm Tube secured with: Tape Dental Injury: Teeth and Oropharynx as per pre-operative assessment

## 2020-07-12 NOTE — Transfer of Care (Signed)
Immediate Anesthesia Transfer of Care Note  Patient: Josephus Harriger. Viveros  Procedure(s) Performed: NEPHROLITHOTOMY PERCUTANEOUS (Left Back)  Patient Location: PACU  Anesthesia Type:General  Level of Consciousness: sedated  Airway & Oxygen Therapy: Patient Spontanous Breathing and Patient connected to face mask oxygen  Post-op Assessment: Report given to RN and Post -op Vital signs reviewed and stable  Post vital signs: Reviewed and stable  Last Vitals:  Vitals Value Taken Time  BP 116/80 07/12/20 1454  Temp    Pulse 78 07/12/20 1455  Resp 13 07/12/20 1456  SpO2 100 % 07/12/20 1455  Vitals shown include unvalidated device data.  Last Pain:  Vitals:   07/12/20 1300  TempSrc:   PainSc: 5          Complications: No complications documented.

## 2020-07-13 DIAGNOSIS — N200 Calculus of kidney: Secondary | ICD-10-CM | POA: Diagnosis not present

## 2020-07-13 LAB — HEMOGLOBIN AND HEMATOCRIT, BLOOD
HCT: 36.4 % — ABNORMAL LOW (ref 39.0–52.0)
Hemoglobin: 10.7 g/dL — ABNORMAL LOW (ref 13.0–17.0)

## 2020-07-13 MED ORDER — OXYCODONE HCL 5 MG PO TABS: 5.0000 mg | ORAL_TABLET | Freq: Four times a day (QID) | ORAL | 0 refills | Status: AC | PRN

## 2020-07-13 MED ORDER — SULFAMETHOXAZOLE-TRIMETHOPRIM 800-160 MG PO TABS: 1.0000 | ORAL_TABLET | Freq: Two times a day (BID) | ORAL | 0 refills | Status: AC

## 2020-07-13 NOTE — Anesthesia Postprocedure Evaluation (Signed)
Anesthesia Post Note  Patient: Brian Wilkins. Brian Wilkins  Procedure(s) Performed: NEPHROLITHOTOMY PERCUTANEOUS (Left Back)     Patient location during evaluation: PACU Anesthesia Type: General Level of consciousness: awake and alert Pain management: pain level controlled Vital Signs Assessment: post-procedure vital signs reviewed and stable Respiratory status: spontaneous breathing, nonlabored ventilation, respiratory function stable and patient connected to nasal cannula oxygen Cardiovascular status: blood pressure returned to baseline and stable Postop Assessment: no apparent nausea or vomiting Anesthetic complications: no   No complications documented.  Last Vitals:  Vitals:   07/13/20 0600 07/13/20 0820  BP: 110/66 127/75  Pulse: 85   Resp: 16   Temp:  36.8 C  SpO2: 97%     Last Pain:  Vitals:   07/13/20 0820  TempSrc: Oral  PainSc:                  Beryle Lathe

## 2020-07-13 NOTE — Progress Notes (Signed)
Patient was unable to get into contact with his ride to get to the shelter that he stays at. Patient was given ride release form.   Patient understands all follow up education as well as when to follow up with urology.   Patient's IV was taken out. Patient wheeled down to ride.

## 2020-07-14 ENCOUNTER — Encounter (HOSPITAL_COMMUNITY): Payer: Self-pay | Admitting: Urology

## 2020-07-16 NOTE — Discharge Summary (Signed)
Patient ID: Brian Wilkins MRN: [id number] DOB/AGE: Sep 15, 1945 75 y.o.  Admit date: 07/12/2020 Discharge date: 07/16/2020  Admitting Dx:  Left renal calculus   Discharge Medications: Allergies as of 07/13/2020      Reactions   Penicillins       Medication List    TAKE these medications   amLODipine 5 MG tablet Commonly known as: NORVASC Take 5 mg by mouth daily.   gabapentin 100 MG capsule Commonly known as: NEURONTIN Take 100 mg by mouth at bedtime.   losartan 100 MG tablet Commonly known as: COZAAR Take 100 mg by mouth daily.   oxyCODONE 5 MG immediate release tablet Commonly known as: Oxy IR/ROXICODONE Take 1 tablet (5 mg total) by mouth every 6 (six) hours as needed for moderate pain or severe pain.   polyvinyl alcohol 1.4 % ophthalmic solution Commonly known as: LIQUIFILM TEARS Place 1 drop into both eyes as needed for dry eyes.   QUEtiapine 200 MG tablet Commonly known as: SEROQUEL Take 200 mg by mouth at bedtime.   sulfamethoxazole-trimethoprim 800-160 MG tablet Commonly known as: BACTRIM DS Take 1 tablet by mouth 2 (two) times daily.        Significant Diagnostic Studies:  DG C-Arm 1-60 Min-No Report  Result Date: 07/12/2020 Fluoroscopy was utilized by the requesting physician.  No radiographic interpretation.   IR URETERAL STENT LEFT NEW ACCESS W/O SEP NEPHROSTOMY CATH  Result Date: 07/12/2020 INDICATION: 75 year old male with history of left nephrolithiasis. EXAM: Ultrasound and fluoroscopic guided placement of nephroureteral catheter COMPARISON:  05/16/2020 MEDICATIONS: Ciprofloxacin 400 mg IV; The antibiotic was administered in an appropriate time frame prior to skin puncture. ANESTHESIA/SEDATION: Fentanyl 100 mcg IV; Versed 4 mg IV Moderate Sedation Time:  41 The patient was continuously monitored during the procedure by the interventional radiology nurse under my direct supervision. CONTRAST:  15 mL Omnipaque 300-administered into the collecting  system(s) FLUOROSCOPY TIME:  Fluoroscopy Time: 8 minutes 18 seconds (176 mGy). COMPLICATIONS: None immediate. PROCEDURE: Informed written consent was obtained from the patient after a thorough discussion of the procedural risks, benefits and alternatives. All questions were addressed. Maximal Sterile Barrier Technique was utilized including caps, mask, sterile gowns, sterile gloves, sterile drape, hand hygiene and skin antiseptic. A timeout was performed prior to the initiation of the procedure. The patient was position prone on the IR table. The left flank was prepped and draped in standard fashion. Ultrasound evaluation demonstrated no evidence of hydronephrosis with similar appearing approximately 3 cm simple cyst arising from the mid to lower pole. The echogenic, shadowing renal pelvis calculus was visualized. The procedure was planned. Subdermal Local anesthesia and deeper local anesthetic around the renal capsule was provided with 1% lidocaine with 1:100,000 epinephrine. Under direct ultrasound visualization, a 21 gauge Chiba needle was directed into the lower pole. Unfortunate is no E flux of urine and the Mandril wire would not track within the collecting system. The needle was removed. Next, from a more superior location, direct fluoroscopic puncture was made targeting radiopaque renal pelvis calculus. Position within the collecting system was confirmed with passage of the Mandril wire into the proximal ureter followed by a minimal amount of contrast. Air was then injected to opacify the anti dependent/posterior calices in the lower pole. Under direct fluoroscopic visualization, an additional 21 gauge Chiba needle was directed into a posterior, inferior pole calyx. A wire passed freely into the collecting system and was coiled in the upper pole. An Accustick sheath was then inserted over the wire.  Appropriate position within the collecting system was confirmed with hand injection of contrast. A Bentson wire  was attempted to access the proximal ureter, however is unable to pass the stone in the pelvis. Therefore, a stiff Glidewire was used which was directed with ease to ureter and into the urinary bladder. A 5 [place] Kumpe the catheter was then inserted with the catheter tip in the urinary bladder. Position was confirmed with injection of contrast. The catheter was sutured to the skin with a 0 silk suture near the skin entry site. Sterile bandage was placed. The patient was transferred back to the preoperative recovery area prior to proceeding to the OR for lithotripsy. The patient tolerated the procedure well without immediate complication. IMPRESSION: Successful placement of 5 French nephroureteral catheter via a lower, posterior pole calyx for lithotripsy access. Marliss Coots, MD [facility] Radiology Electronically Signed   By: Marliss Coots MD   On: 07/12/2020 12:06    Brief H and P: For complete details please refer to admission H and P, but in brief  The patient is admitted for management of a large left renal calculus  Hospital Course:    He was admitted directly to the interventional radiology suite were access was gained to the left renal pelvis.  He was then subsequently taken to the operating room where left percutaneous nephrolithotomy was performed.  He tolerated the procedure well.  This was a tube was procedure, with a stent left in.  The patient recuperated well and was discharged home on postoperative day 1.  Day of Discharge BP 135/74 (BP Location: Left Arm)   Pulse 88   Temp 98.5 F (36.9 C) (Oral)   Resp 18   Ht 5\' 4"  (1.626 m)   Wt 84.4 kg   SpO2 100%   BMI 31.93 kg/m   No results found for this or any previous visit (from the past 24 hour(s)).  Physical Exam: General: Alert and awake oriented x3 not in any acute distress. HEENT: anicteric sclera, pupils reactive to light and accommodation CVS: S1-S2 clear no murmur rubs or  gallops Chest: clear to auscultation bilaterally, no wheezing rales or rhonchi Abdomen: soft nontender, nondistended, normal bowel sounds, no organomegaly Extremities: no cyanosis, clubbing or edema noted bilaterally Neuro: Cranial nerves II-XII intact, no focal neurological deficits  Disposition:    Home  Diet:   No restrictions  Activity:   Restrictions discussed with patient     DISCHARGE FOLLOW-UP   Follow-up Information    , NP.   Specialty: Nurse Practitioner Why: 1.20.2022 @ 0930 Contact information: [address] [place] [phone]               Time spent on Discharge:    15 minutes  Signed: [phone] Kailo Kosik 07/16/2020, 9:25 AM

## 2022-03-09 IMAGING — XA IR URETURAL STENT LEFT NEW ACCESS W/O SEP NEPHROSTOMY CATH
6 series · 13 of 13 positions shown · non-contrast
Comparison: 05/16/2020

INDICATION: 74-year-old male with history of left nephrolithiasis.

EXAM:
Ultrasound and fluoroscopic guided placement of nephroureteral
catheter

[Series 5: care single · 1 of 1 slices shown (1 of 5)]
[im 1/1]
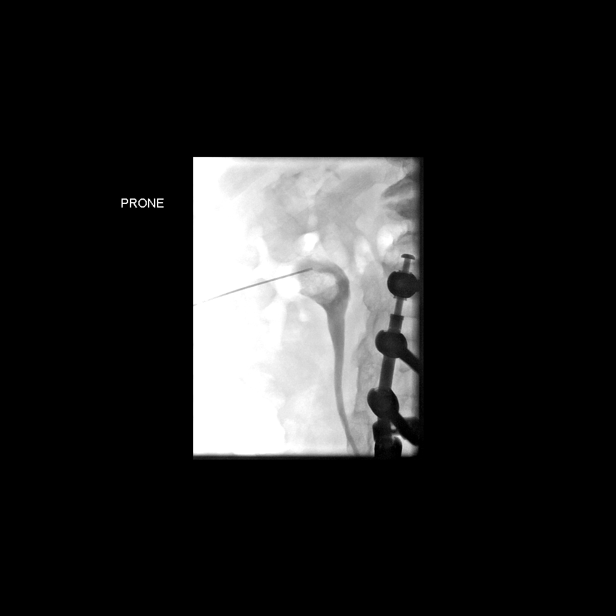

[Series 6: care single · 1 of 1 slices shown (2 of 5)]
[im 1/1]
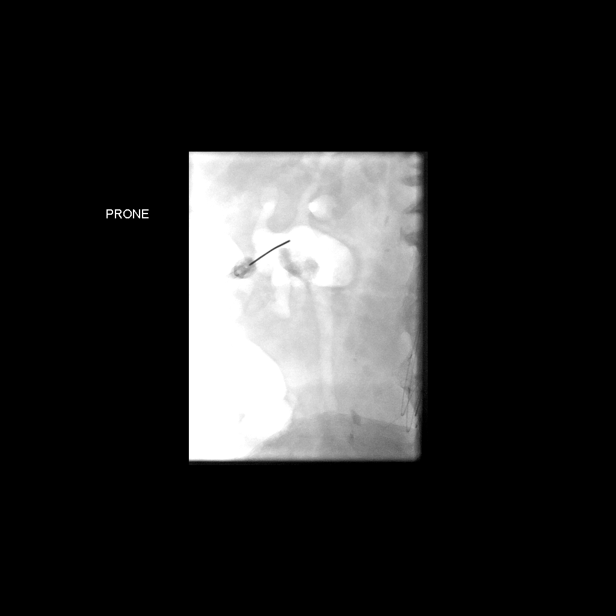

[Series 8: care single · 1 of 1 slices shown (3 of 5)]
[im 1/1]
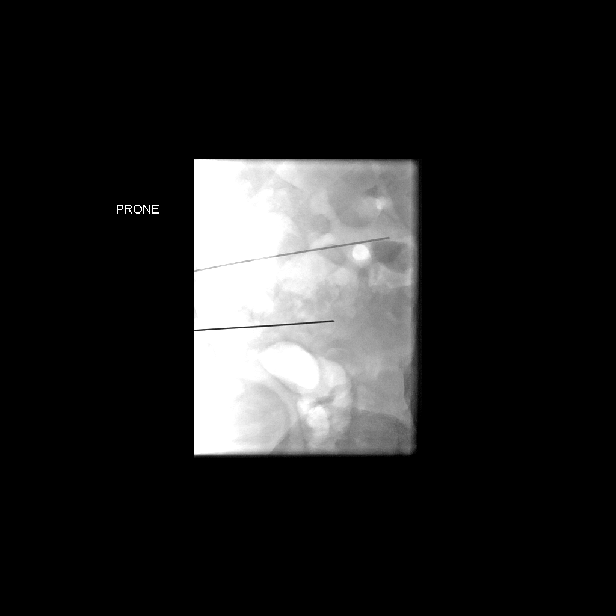

[Series 9: care single · 1 of 1 slices shown (4 of 5)]
[im 1/1]
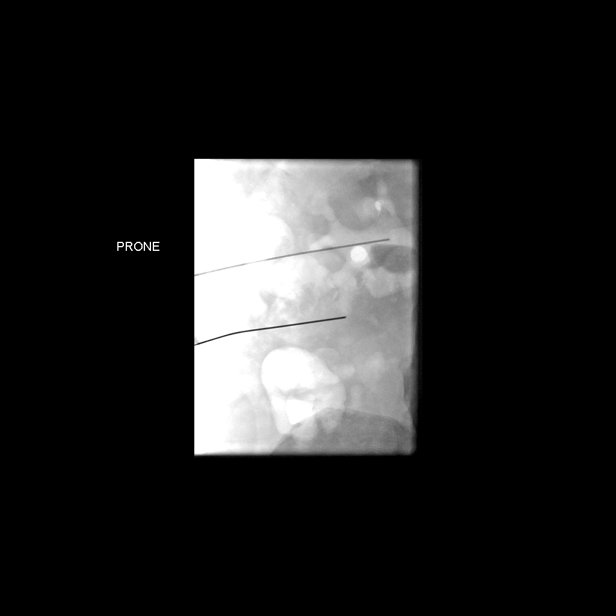

[Series 13: care single · 1 of 1 slices shown (5 of 5)]
[im 1/1]
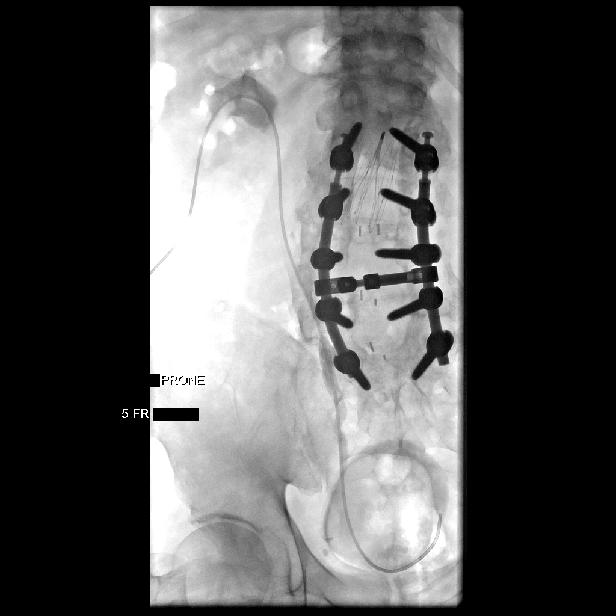

[Series 300: ir ureteral stent left new access w/o se · non-contrast · 8 of 8 slices shown]
[im 1/8]
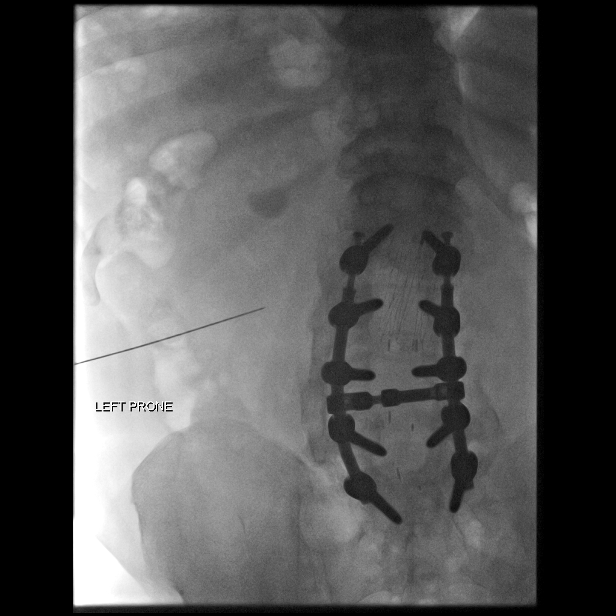
[im 2/8]
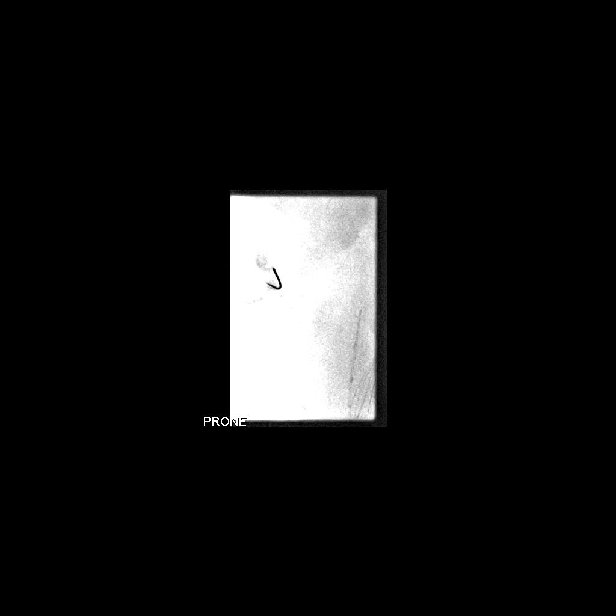
[im 3/8]
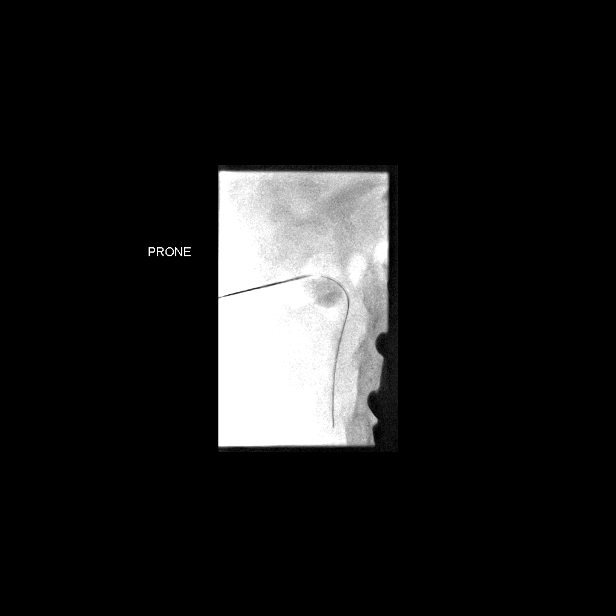
[im 4/8]
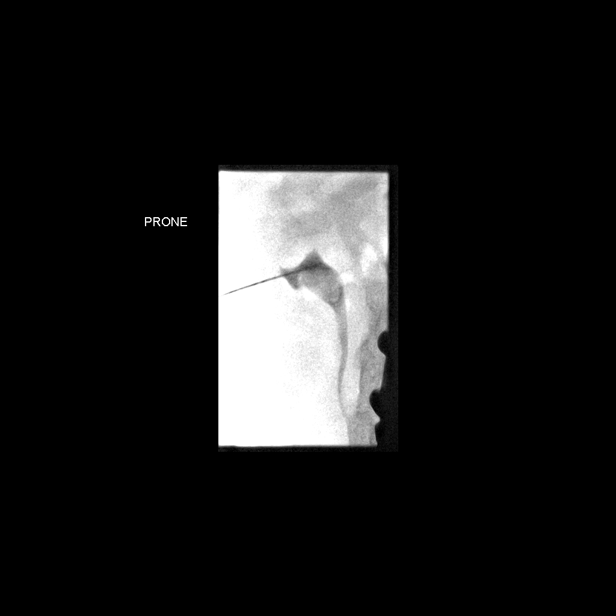
[im 5/8]
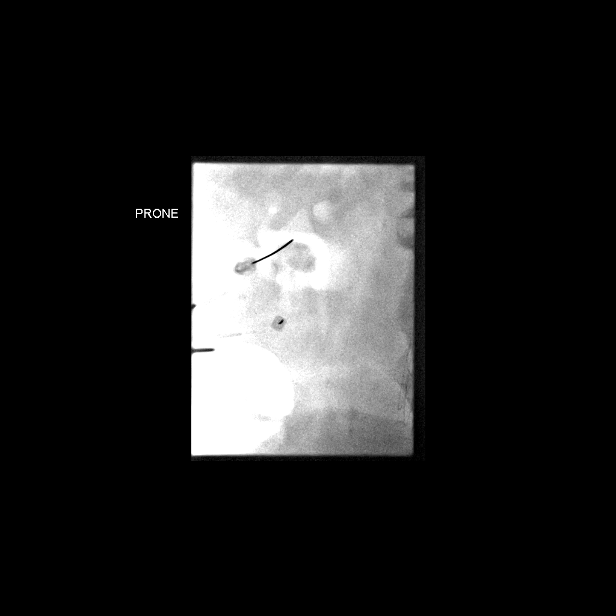
[im 6/8]
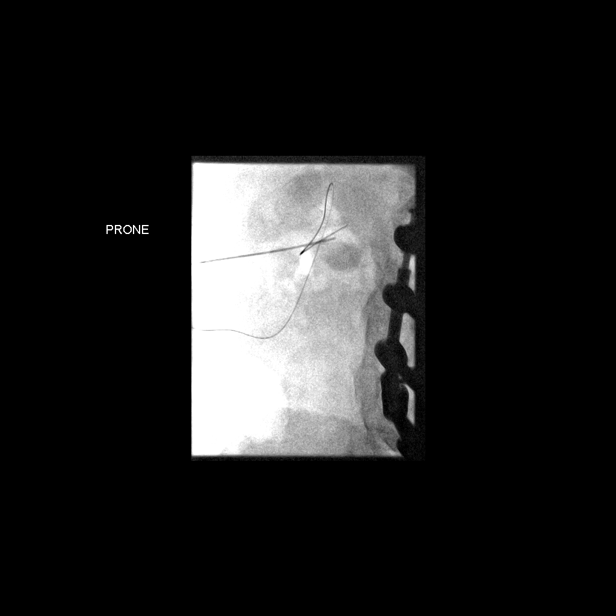
[im 7/8]
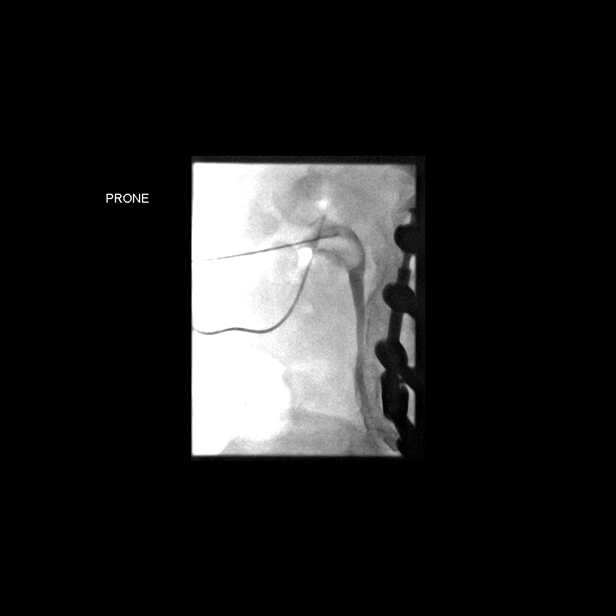
[im 8/8]
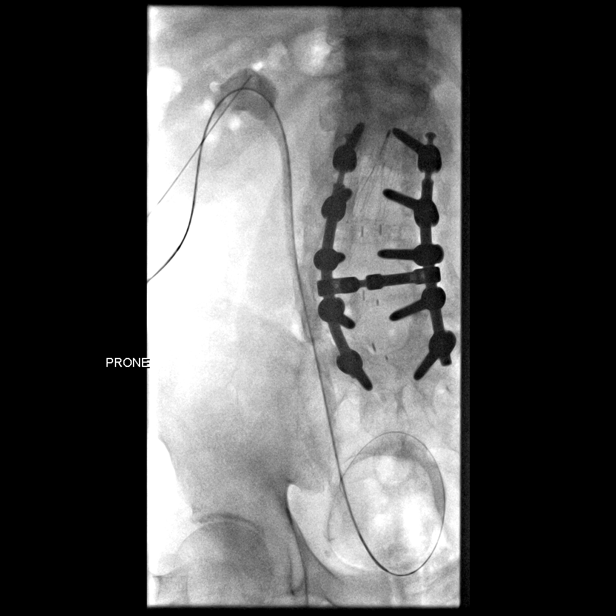

[13 of 13 positions shown; findings below may reference images not displayed]

MEDICATIONS:
Ciprofloxacin 400 mg IV; The antibiotic was administered in an
appropriate time frame prior to skin puncture.

ANESTHESIA/SEDATION:
Fentanyl 100 mcg IV; Versed 4 mg IV

Moderate Sedation Time:  41

The patient was continuously monitored during the procedure by the
interventional radiology nurse under my direct supervision.

CONTRAST:  15 mL Omnipaque 300-administered into the collecting
system(s)

FLUOROSCOPY TIME:  Fluoroscopy Time: 8 minutes 18 seconds (176 mGy).

COMPLICATIONS:
None immediate.

PROCEDURE:
Informed written consent was obtained from the patient after a
thorough discussion of the procedural risks, benefits and
alternatives. All questions were addressed. Maximal Sterile Barrier
Technique was utilized including caps, mask, sterile gowns, sterile
gloves, sterile drape, hand hygiene and skin antiseptic. A timeout
was performed prior to the initiation of the procedure.

The patient was position prone on the IR table. The left flank was
prepped and draped in standard fashion. Ultrasound evaluation
demonstrated no evidence of hydronephrosis with similar appearing
approximately 3 cm simple cyst arising from the mid to lower pole.
The echogenic, shadowing renal pelvis calculus was visualized. The
procedure was planned. Subdermal Local anesthesia and deeper local
anesthetic around the renal capsule was provided with 1% lidocaine
with [DATE] epinephrine.

Under direct ultrasound visualization, a 21 gauge Chiba needle was
directed into the lower pole. Unfortunate is no E flux of urine and
the Mandril wire would not track within the collecting system. The
needle was removed. Next, from a more superior location, direct
fluoroscopic puncture was made targeting radiopaque renal pelvis
calculus. Position within the collecting system was confirmed with
passage of the Mandril wire into the proximal ureter followed by a
minimal amount of contrast. Air was then injected to opacify the
anti dependent/posterior calices in the lower pole. Under direct
fluoroscopic visualization, an additional 21 gauge Chiba needle was
directed into a posterior, inferior pole calyx. A wire passed freely
into the collecting system and was coiled in the upper pole. An
Accustick sheath was then inserted over the wire. Appropriate
position within the collecting system was confirmed with hand
injection of contrast. A Bentson wire was attempted to access the
proximal ureter, however is unable to pass the stone in the pelvis.
Therefore, a stiff Glidewire was used which was directed with ease
to ureter and into the urinary bladder. A 5 French Kumpe the
catheter was then inserted with the catheter tip in the urinary
bladder. Position was confirmed with injection of contrast.

The catheter was sutured to the skin with a 0 silk suture near the
skin entry site. Sterile bandage was placed. The patient was
transferred back to the preoperative recovery area prior to
proceeding to the OR for lithotripsy. The patient tolerated the
procedure well without immediate complication.
IMPRESSION: Successful placement of 5 French nephroureteral catheter via a
lower, posterior pole calyx for lithotripsy access.
